# Patient Record
Sex: Female | Born: 1995 | Race: Black or African American | Hispanic: No | Marital: Single | Smoking: Current every day smoker
Health system: Southern US, Community
[De-identification: ages and names within clinical notes are randomized; demographics above are authoritative.]

## PROBLEM LIST (undated history)

## (undated) DIAGNOSIS — M869 Osteomyelitis, unspecified: Secondary | ICD-10-CM

---
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
## 2020-07-16 ENCOUNTER — Emergency Department (HOSPITAL_BASED_OUTPATIENT_CLINIC_OR_DEPARTMENT_OTHER)
Admission: EM | Admit: 2020-07-16 | Discharge: 2020-07-16 | Disposition: A | Discharge status: Home / Self Care | Payer: Medicaid Other | Attending: Emergency Medicine | Admitting: Emergency Medicine

## 2020-07-16 ENCOUNTER — Other Ambulatory Visit: Payer: Self-pay

## 2020-07-16 ENCOUNTER — Encounter (HOSPITAL_BASED_OUTPATIENT_CLINIC_OR_DEPARTMENT_OTHER): Payer: Self-pay | Admitting: *Deleted

## 2020-07-16 ENCOUNTER — Emergency Department (HOSPITAL_BASED_OUTPATIENT_CLINIC_OR_DEPARTMENT_OTHER): Payer: Medicaid Other

## 2020-07-16 DIAGNOSIS — M79671 Pain in right foot: Secondary | ICD-10-CM | POA: Diagnosis not present

## 2020-07-16 DIAGNOSIS — F17200 Nicotine dependence, unspecified, uncomplicated: Secondary | ICD-10-CM | POA: Insufficient documentation

## 2020-07-16 NOTE — ED Triage Notes (Signed)
C/o right foot pain x 4 days, denies injury

## 2020-07-16 NOTE — Discharge Instructions (Signed)
At this time there does not appear to be the presence of an emergent medical condition, however there is always the potential for conditions to change. Please read and follow the below instructions.  Please return to the Emergency Department immediately for any new or worsening symptoms. Please be sure to follow up with your Primary Care Provider within one week regarding your visit today; please call their office to schedule an appointment even if you are feeling better for a follow-up visit. Please use rest ice and elevation to help with your foot pain.  You may call the sports medicine specialist Dr. Jordan Likes on your discharge paperwork to schedule follow-up with you for further evaluation treatment.  You may use over-the-counter anti-inflammatory such as Tylenol as directed on the packaging to help with your symptoms.  Go to the nearest Emergency Department immediately if: You have fever or chills Your foot is numb or tingling. Your foot or toes are swollen. Your foot or toes turn white or blue. You have warmth and redness along your foot. You have any new/concerning or worsening of symptoms  Please read the additional information packets attached to your discharge summary.  Do not take your medicine if  develop an itchy rash, swelling in your mouth or lips, or difficulty breathing; call 911 and seek immediate emergency medical attention if this occurs.  You may review your lab tests and imaging results in their entirety on your MyChart account.  Please discuss all results of fully with your primary care provider and other specialist at your follow-up visit.  Note: Portions of this text may have been transcribed using voice recognition software. Every effort was made to ensure accuracy; however, inadvertent computerized transcription errors may still be present.

## 2020-07-16 NOTE — ED Notes (Signed)
VS recheck deferred d/t pt at nurses' station asking for discharge papers.

## 2020-07-16 NOTE — ED Provider Notes (Signed)
[facility] EMERGENCY DEPARTMENT Provider Note   CSN: [id number] Arrival date & time: 07/16/20  1230     History Chief Complaint  Patient presents with  . Foot Pain    Jaycelyn Horiuchi is a 24 y.o. female history of obesity reports herself as otherwise healthy and only daily medication uses birth control.  Patient arrives today for right foot pain onset 4-5 days ago reports pain began after a long shift at work she reports she stands for up to 10 hours a day.  Pain has occurred daily with work she reports of throbbing pain to the bottom of her right foot constant nonradiating worsened with ambulation improved with rest.  Denies similar problem in the past.  Denies fall/injury, fever/chills, swelling/color change, numbness/ting, weakness or any additional concerns  HPI     Past Medical History:  Diagnosis Date  . Bone infection, knee (HCC) 2003   infecton to knee; MRI done    Patient Active Problem List   Diagnosis Date Noted  . Anemia associated with acute blood loss 05/24/2012  . Anemia 05/23/2012  . Abnormal uterine bleeding 05/23/2012  . Abdominal pain 05/23/2012  . Iron deficiency anemia 05/23/2012  . Bacterial vaginosis 05/23/2012  . PID (acute pelvic inflammatory disease) 05/23/2012    Past Surgical History:  Procedure Laterality Date  . CESAREAN SECTION       OB History   No obstetric history on file.     Family History  Problem Relation Age of Onset  . Arthritis Mother   . Diabetes Mother   . Hypertension Father   . Asthma Sister   . Drug abuse Maternal Uncle   . Diabetes Maternal Grandmother   . Hyperlipidemia Maternal Grandmother   . Diabetes Maternal Grandfather     Social History   Tobacco Use  . Smoking status: Light Tobacco Smoker  . Smokeless tobacco: Never Used  Substance Use Topics  . Alcohol use: No  . Drug use: No    Home Medications Prior to Admission medications   Medication Sig Start Date End Date Taking?  Authorizing Provider  docusate sodium 100 MG CAPS Take 100 mg by mouth 2 (two) times daily as needed for constipation. 05/25/12   Glori Luis, MD  doxycycline (VIBRA-TABS) 100 MG tablet Take 1 tablet (100 mg total) by mouth every 12 (twelve) hours. 05/25/12   Glori Luis, MD  ferrous sulfate 325 (65 FE) MG tablet Take 1 tablet (325 mg total) by mouth 3 (three) times daily. 05/25/12   Glori Luis, MD  metroNIDAZOLE (FLAGYL) 500 MG tablet Take 1 tablet (500 mg total) by mouth every 12 (twelve) hours. 05/25/12   Glori Luis, MD  norethindrone-ethinyl estradiol 1/35 (NECON 1/35, 28,) tablet Take 1 tablet by mouth daily. 05/25/12   Glori Luis, MD    Allergies    Patient has no known allergies.  Review of Systems   Review of Systems  Constitutional: Negative.  Negative for chills and fever.  Musculoskeletal: Positive for arthralgias (Right foot). Negative for back pain and joint swelling.  Skin: Negative.  Negative for color change and wound.  Neurological: Negative.  Negative for weakness and numbness.    Physical Exam Updated Vital Signs BP 115/77   Pulse 89   Temp 98.1 F (36.7 C) (Oral)   Resp 16   Ht 5\' 7"  (1.702 m)   Wt (!) 145.6 kg   LMP 07/12/2020 (Approximate)   SpO2 100%   BMI 50.28 kg/m  Physical Exam Constitutional:      General: She is not in acute distress.    Appearance: Normal appearance. She is well-developed. She is obese. She is not ill-appearing or diaphoretic.  HENT:     Head: Normocephalic and atraumatic.  Eyes:     General: Vision grossly intact. Gaze aligned appropriately.     Pupils: Pupils are equal, round, and reactive to light.  Neck:     Trachea: Trachea and phonation normal.  Cardiovascular:     Pulses:          Dorsalis pedis pulses are 2+ on the right side and 2+ on the left side.  Pulmonary:     Effort: Pulmonary effort is normal. No respiratory distress.  Abdominal:     General: There is no distension.      Palpations: Abdomen is soft.     Tenderness: There is no abdominal tenderness. There is no guarding or rebound.  Musculoskeletal:        General: Normal range of motion.     Cervical back: Normal range of motion.       Feet:  Feet:     Right foot:     Protective Sensation: 5 sites tested. 5 sites sensed.     Skin integrity: Skin integrity normal.     Left foot:     Protective Sensation: 5 sites tested. 5 sites sensed.     Skin integrity: Skin integrity normal.     Comments: Tenderness to the mid sole as indicated without overlying skin change.  No pain with ulcer of the foot, no bony tenderness.  No tenderness of the ankle or knee.  Pedal pulses intact and equal bilaterally.  Flexion extension inversion eversion intact of the ankle without pain.  Some pain with flexion at the toes no pain with extension. Skin:    General: Skin is warm and dry.  Neurological:     Mental Status: She is alert.     GCS: GCS eye subscore is 4. GCS verbal subscore is 5. GCS motor subscore is 6.     Comments: Speech is clear and goal oriented, follows commands Major Cranial nerves without deficit, no facial droop Moves extremities without ataxia, coordination intact  Psychiatric:        Behavior: Behavior normal.    ED Results / Procedures / Treatments   Labs (all labs ordered are listed, but only abnormal results are displayed) Labs Reviewed - No data to display  EKG None  Radiology DG Foot Complete Right  Result Date: 07/16/2020 CLINICAL DATA:  Right foot pain plantar aspect of the hindfoot EXAM: RIGHT FOOT COMPLETE - 3+ VIEW COMPARISON:  None. FINDINGS: Frontal, oblique, and lateral views of the right foot are obtained. No fracture, subluxation, or dislocation. Joint spaces are well preserved. Soft tissues are normal. There are small calcaneal spurs. IMPRESSION: 1. Small calcaneal spurs.  Otherwise unremarkable exam. Electronically Signed   By: Sharlet Salina M.D.   On: 07/16/2020 16:58     Procedures Procedures (including critical care time)  Medications Ordered in ED Medications - No data to display  ED Course  I have reviewed the triage vital signs and the nursing notes.  Pertinent labs & imaging results that were available during my care of the patient were reviewed by me and considered in my medical decision making (see chart for details).    MDM Rules/Calculators/A&P  Additional history obtained from: 1. Nursing notes from this visit. 2. Review of electronic medical records, no recent ER visits ------------ 24 year old female presented for right foot pain that began this week after her long shifts at work standing.  She has some tenderness to the bottom of the foot and examination is suspicious for plantars fasciitis without evidence of infection.  X-ray was obtained in triage and shows some calcaneal spurs.  No evidence for DVT, septic arthritis, cellulitis, compartment syndrome, neurovascular compromise, traumatic injury, fracture/dislocation or other emergent pathologies.  Patient appears stable for discharge with outpatient orthopedist follow-up.  Discussed rice therapy and OTC anti-inflammatories.  Patient given a work note.  She was offered crutches but refused.  No pain of the ankle lower leg knee or elsewhere requiring further imaging. Work note given.  At this time there does not appear to be any evidence of an acute emergency medical condition and the patient appears stable for discharge with appropriate outpatient follow up. Diagnosis was discussed with patient who verbalizes understanding of care plan and is agreeable to discharge. I have discussed return precautions with patient  who verbalizes understanding. Patient encouraged to follow-up with their PCP and ortho. All questions answered.   Note: Portions of this report may have been transcribed using voice recognition software. Every effort was made to ensure accuracy; however,  inadvertent computerized transcription errors may still be present. Final Clinical Impression(s) / ED Diagnoses Final diagnoses:  Right foot pain    Rx / DC Orders ED Discharge Orders    None       Elizabeth Palau 07/16/20 1752    Maia Plan, MD 07/17/20 1135

## 2021-08-22 ENCOUNTER — Encounter (HOSPITAL_BASED_OUTPATIENT_CLINIC_OR_DEPARTMENT_OTHER): Payer: Self-pay | Admitting: *Deleted

## 2021-08-22 ENCOUNTER — Other Ambulatory Visit: Payer: Self-pay

## 2021-08-22 ENCOUNTER — Emergency Department (HOSPITAL_BASED_OUTPATIENT_CLINIC_OR_DEPARTMENT_OTHER)
Admission: EM | Admit: 2021-08-22 | Discharge: 2021-08-22 | Disposition: A | Discharge status: Home / Self Care | Payer: Medicaid Other | Attending: Emergency Medicine | Admitting: Emergency Medicine

## 2021-08-22 DIAGNOSIS — N939 Abnormal uterine and vaginal bleeding, unspecified: Secondary | ICD-10-CM

## 2021-08-22 DIAGNOSIS — I10 Essential (primary) hypertension: Secondary | ICD-10-CM | POA: Diagnosis not present

## 2021-08-22 DIAGNOSIS — F419 Anxiety disorder, unspecified: Secondary | ICD-10-CM

## 2021-08-22 MED ORDER — HYDROXYZINE HCL 25 MG PO TABS: 25.0000 mg | ORAL_TABLET | Freq: Three times a day (TID) | ORAL | 0 refills | Status: AC | PRN

## 2021-08-22 NOTE — ED Triage Notes (Signed)
She has had elevated BP for 2 weeks. Heavy vaginal bleeding x 3 weeks. She does not have a hx of HTN. Headache x 3 days with nausea. Dizziness at work today.

## 2021-08-22 NOTE — ED Provider Notes (Signed)
[place] EMERGENCY DEPARTMENT Provider Note   CSN: [id number] Arrival date & time: 08/22/21  1923     History  Chief Complaint  Patient presents with   Hypertension    Catherine Barnett is a 26 y.o. female presenting with complaint of hypertension.  Patient reports that she has been seen by her GYN and had elevated blood pressures occasionally.  She is concerned because she has never been started on anything.  Denies any headaches or chest pain.  Also complaining of heavy vaginal bleeding.  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  Reports that her last menstrual period was a week ago however it went away and she started bleeding again this past Friday.  At the time that she started bleeding she had some birth control pills and decided to take 1 to get it to stop.  Reports that it did not fully stop.  Is supposed to be on daily birth control however ran out for a while and then decided that she wanted to "let her body regulate itself."  Does not have a primary care provider, but her OB/GYN takes her blood pressure which makes her anxious.  She has an appointment with them on Wednesday.  While on her way here she said "I could not hold it in anymore," and stated that she has been very anxious and crying over her blood pressure and bleeding.  Has never seen a Social worker.  Home Medications Prior to Admission medications   Medication Sig Start Date End Date Taking? Authorizing Provider  norethindrone-ethinyl estradiol-FE (LOESTRIN FE) 1-20 MG-MCG tablet Take 1 tablet by mouth daily. 08/09/21  Yes [provider]  docusate sodium 100 MG CAPS Take 100 mg by mouth 2 (two) times daily as needed for constipation. 05/25/12   Leone Haven, MD  doxycycline (VIBRA-TABS) 100 MG tablet Take 1 tablet (100 mg total) by mouth every 12 (twelve) hours. 05/25/12   Leone Haven, MD  ferrous sulfate 325 (65 FE) MG tablet Take 1 tablet (325 mg total) by mouth 3 (three) times  daily. 05/25/12   Leone Haven, MD  metroNIDAZOLE (FLAGYL) 500 MG tablet Take 1 tablet (500 mg total) by mouth every 12 (twelve) hours. 05/25/12   Leone Haven, MD  norethindrone-ethinyl estradiol 1/35 (NECON 1/35, 28,) tablet Take 1 tablet by mouth daily. 05/25/12   Leone Haven, MD      Allergies    Patient has no known allergies.    Review of Systems   Review of Systems  Respiratory:  Negative for shortness of breath.   Cardiovascular:  Negative for chest pain.  Genitourinary:  Positive for menstrual problem and vaginal bleeding. Negative for dysuria, vaginal discharge and vaginal pain.  Psychiatric/Behavioral:  The patient is nervous/anxious.    Physical Exam Updated Vital Signs BP 130/72 (BP Location: Right Arm)    Pulse 73    Temp 99.6 F (37.6 C) (Oral)    Resp 20    Ht 5\' 7"  (1.702 m)    Wt (!) 148.4 kg    SpO2 100%    BMI 51.25 kg/m  Physical Exam Vitals and nursing note reviewed.  Constitutional:      Appearance: Normal appearance.  HENT:     Head: Normocephalic and atraumatic.  Eyes:     General: No scleral icterus.    Conjunctiva/sclera: Conjunctivae normal.  Cardiovascular:     Rate and Rhythm: Normal rate and regular rhythm.  Pulmonary:  Effort: Pulmonary effort is normal. No respiratory distress.     Breath sounds: No wheezing.  Abdominal:     Tenderness: There is no abdominal tenderness.  Skin:    Findings: No rash.  Neurological:     Mental Status: She is alert.  Psychiatric:        Mood and Affect: Mood normal.    ED Results / Procedures / Treatments   Labs (all labs ordered are listed, but only abnormal results are displayed) Labs Reviewed - No data to display  EKG None  Radiology No results found.  Procedures Procedures   Medications Ordered in ED Medications - No data to display  ED Course/ Medical Decision Making/ A&P                           Medical Decision Making  26 year old female presenting due to  elevated blood pressures at OB/GYN, reports that these have been in the 0000000 systolic.  Unable to state diastolic reading.  She is also having trouble regulating her.  However she seems to only take birth control pills when she wants to get her period to stop.  Is inconsistent with her OCPs, therefore abnormal uterine bleeding is the likely result.  She has an upcoming appointment this week with GYN, I believe that is an appropriate specialist to handle her vaginal bleeding.  She has not been very hypertensive in the department today.  First blood pressure was 158/109.  They have been trending downward and she currently is at 120/61.  She denies headaches, chest pain, abdominal pain or back pain.  I do not believe we need to intervene on her elevated blood pressures, we discussed that primary care is the correct office to follow her blood pressure and initiate medication if they see fit.  She reported understanding.  She does not have a primary care provider so she has been given a referral.  At this point I believe the patient is stable for discharge home.  She reported large amounts of anxiety that come and go, and we discussed the importance of speaking about this with her PCP.  Denies any SI/HI.  Endorses difficulty sleeping.  I will send some hydroxyzine to the pharmacy for her anxiety and sleep.        Final Clinical Impression(s) / ED Diagnoses Final diagnoses:  Anxiety  Abnormal uterine bleeding    Rx / DC Orders Results and diagnoses were explained to the patient. Return precautions discussed in full. Patient had no additional questions and expressed complete understanding.   This chart was dictated using voice recognition software.  Despite best efforts to proofread,  errors can occur which can change the documentation meaning.      Darliss Ridgel 08/23/21 1118    Blanchie Dessert, MD 08/26/21 [phone]

## 2021-08-22 NOTE — Discharge Instructions (Addendum)
Please be sure to follow-up with your OB/GYN on Wednesday as scheduled.  I have attached a referral to primary care attached to these papers.  It is important for you to establish care with someone who may follow your blood pressure.  Medications for anxiety and sent to your pharmacy.

## 2021-10-26 IMAGING — CR DG FOOT COMPLETE 3+V*R*
3 series · 3 of 3 positions shown · non-contrast
Comparison: None.

CLINICAL DATA: Right foot pain plantar aspect of the hindfoot

EXAM:
RIGHT FOOT COMPLETE - 3+ VIEW

[t foot ap right]
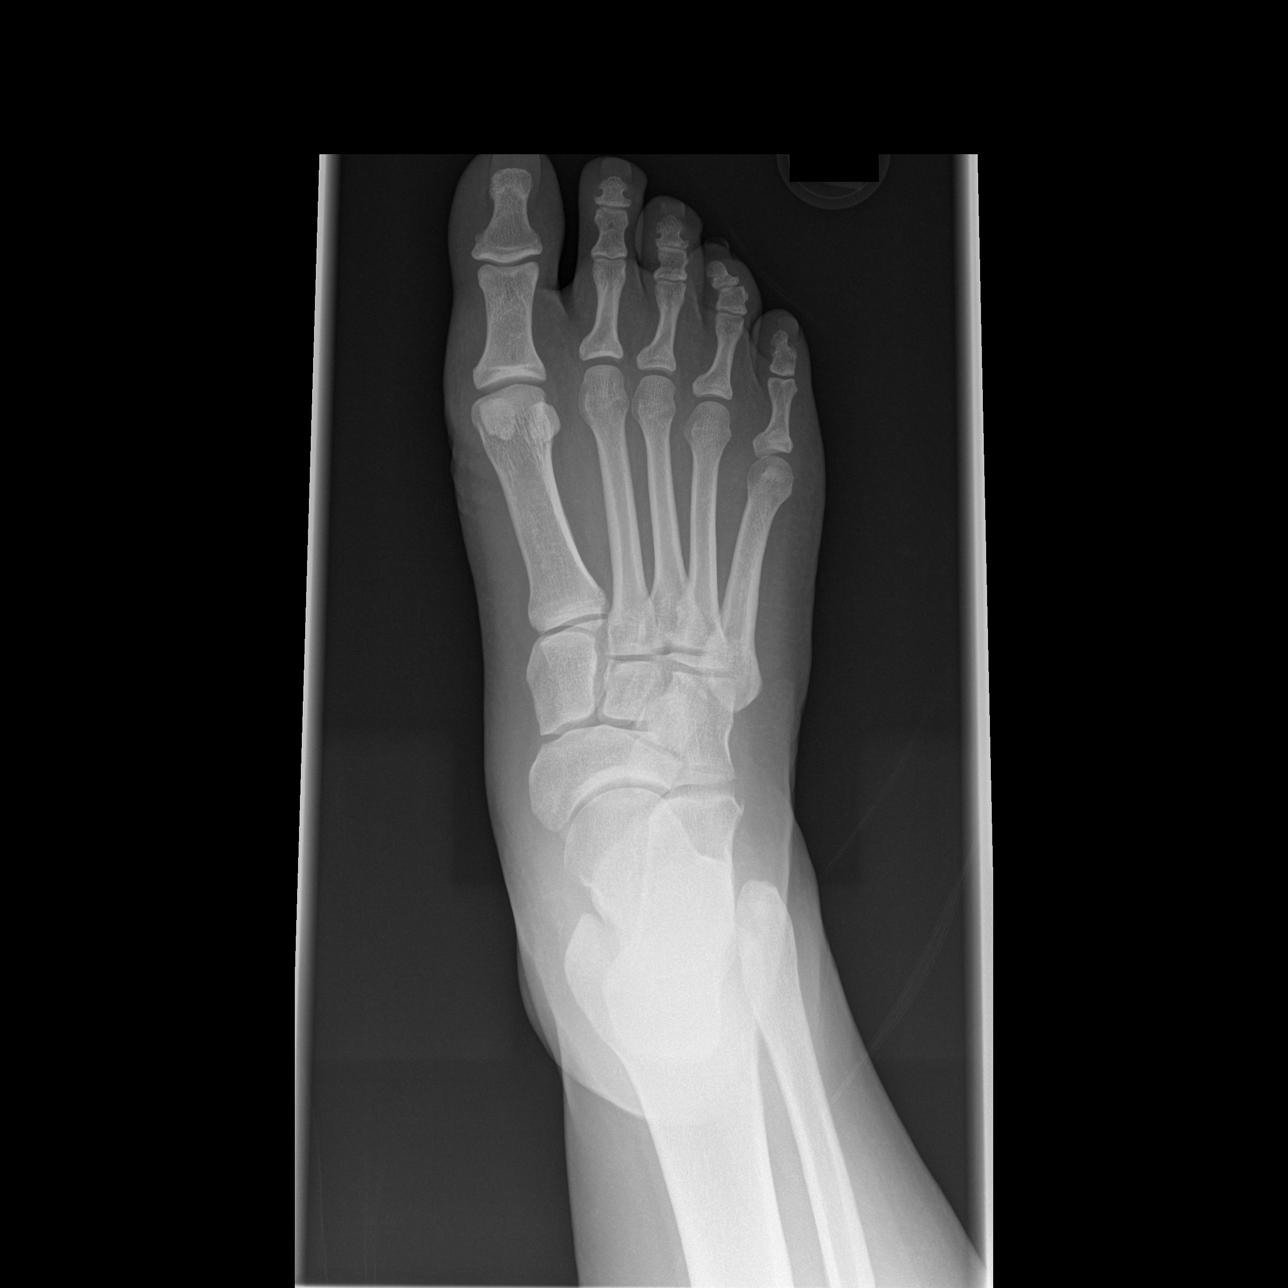

[t foot oblique right]
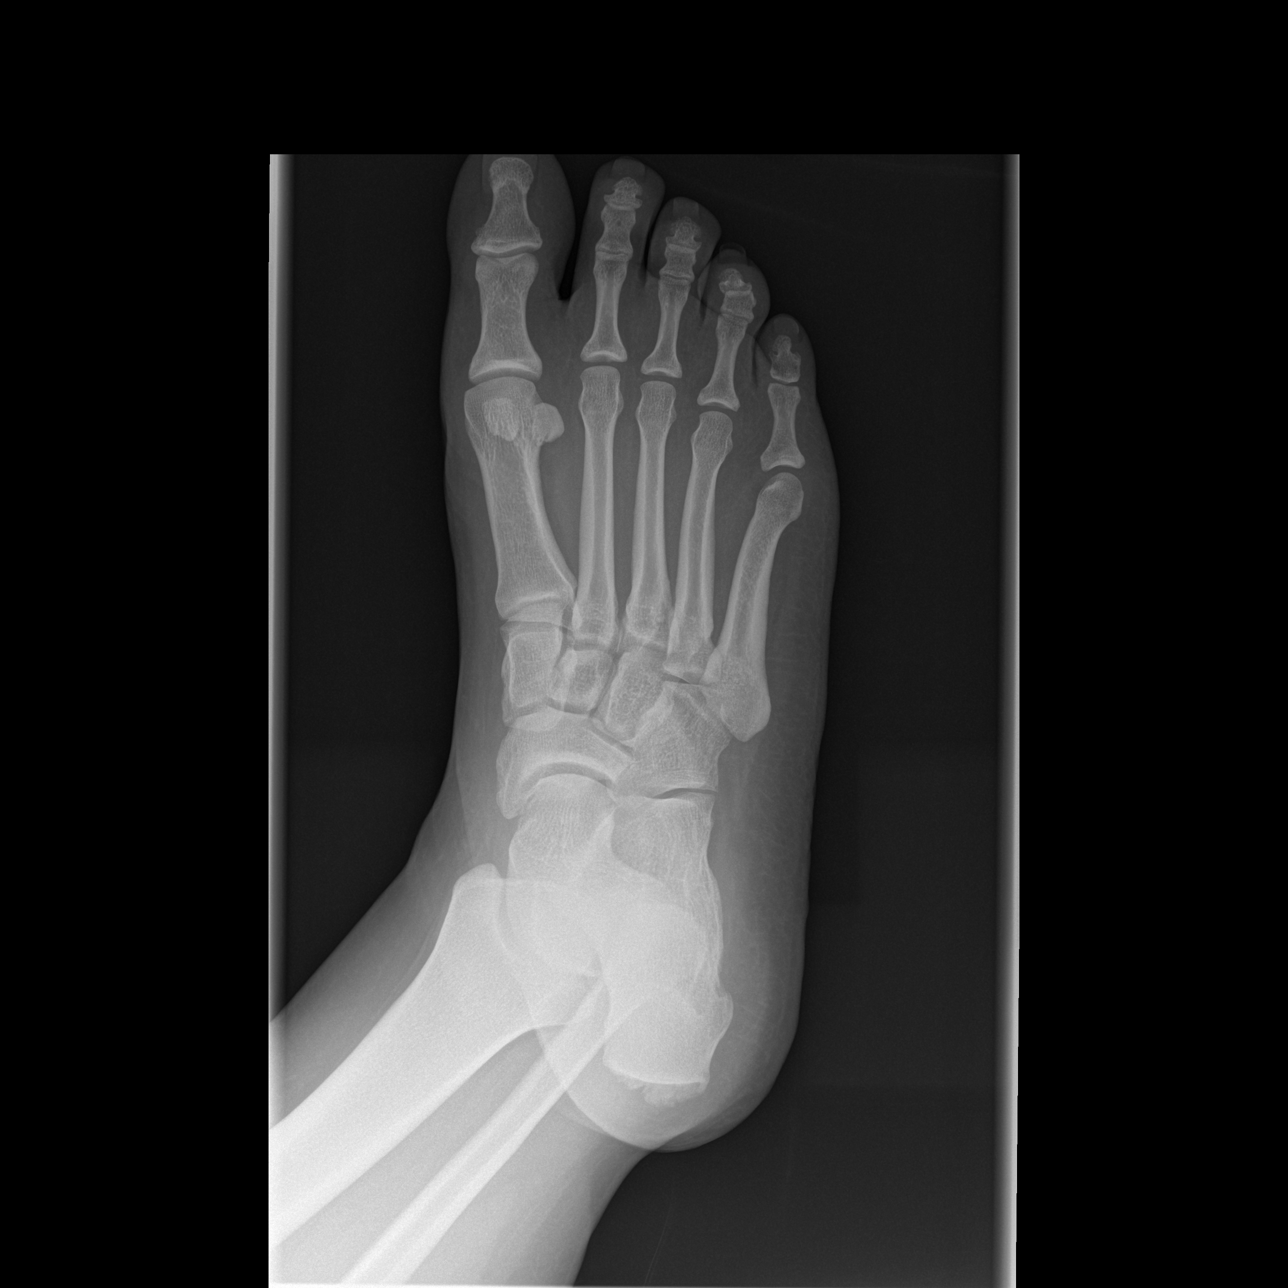

[t foot lat right]
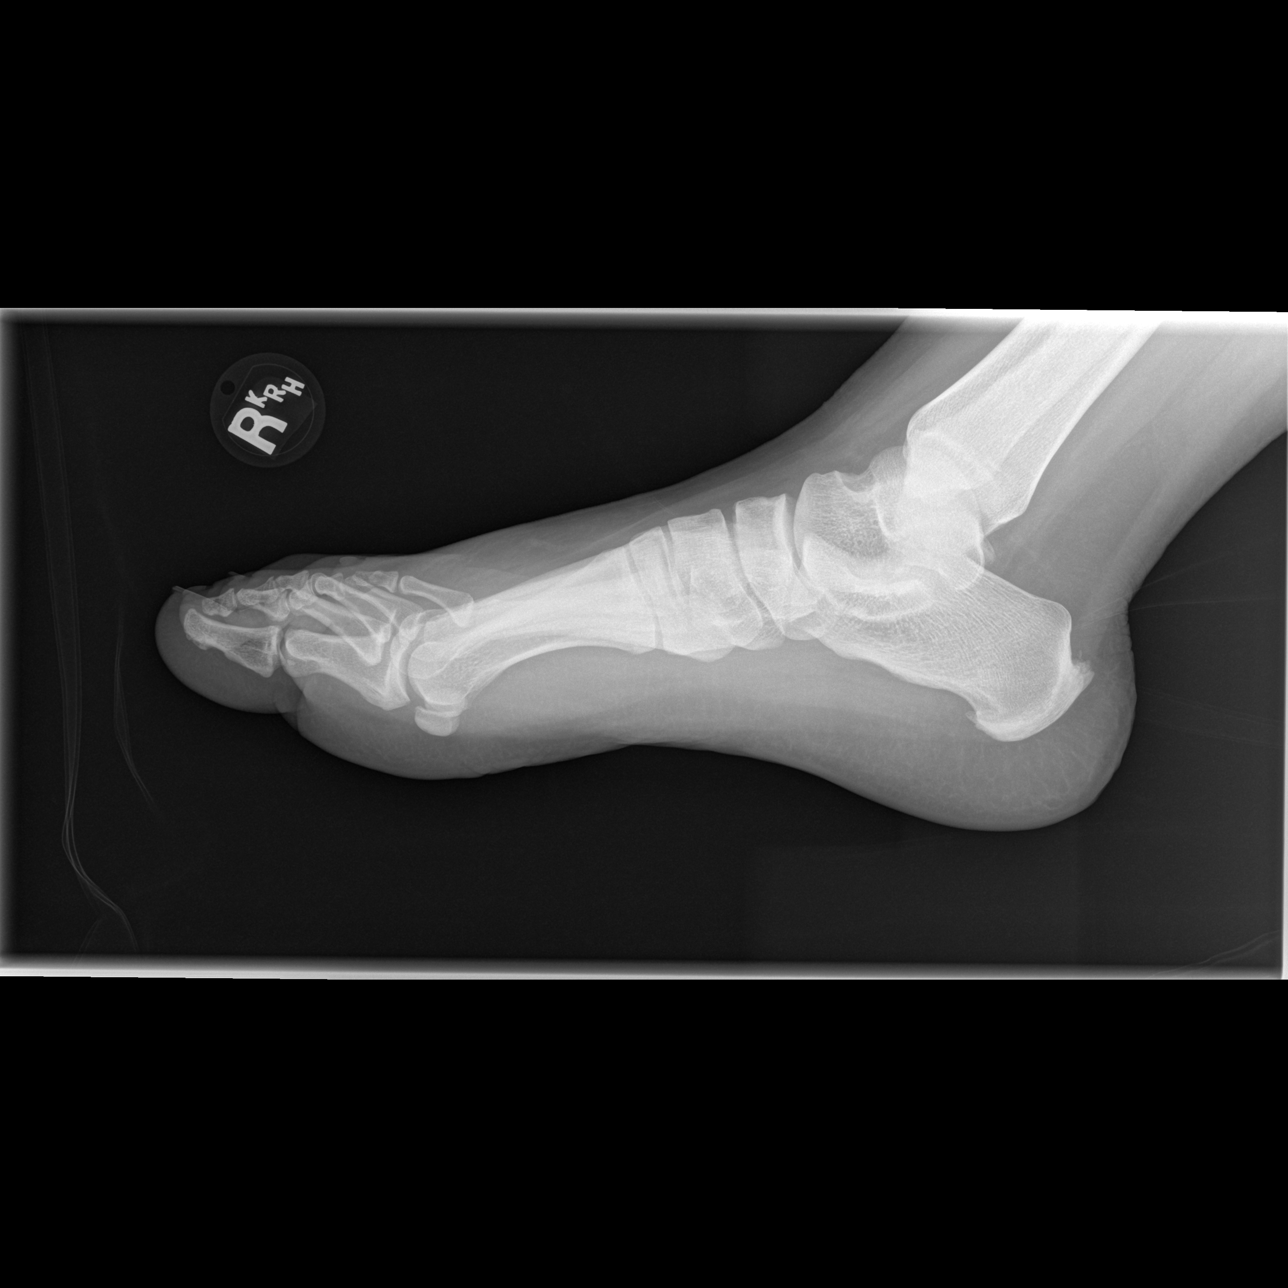

[3 of 3 positions shown; findings below may reference images not displayed]

FINDINGS: Frontal, oblique, and lateral views of the right foot are obtained.
No fracture, subluxation, or dislocation. Joint spaces are well
preserved. Soft tissues are normal. There are small calcaneal spurs.
IMPRESSION: 1. Small calcaneal spurs.  Otherwise unremarkable exam.
